# Patient Record
Sex: Male | Born: 1985 | Race: White | Hispanic: No | Marital: Single | Smoking: Former smoker
Health system: Southern US, Community
[De-identification: ages and names within clinical notes are randomized; demographics above are authoritative.]

## PROBLEM LIST (undated history)

## (undated) DIAGNOSIS — J302 Other seasonal allergic rhinitis: Secondary | ICD-10-CM

## (undated) HISTORY — DX: Other seasonal allergic rhinitis: J30.2

---
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
## 2011-04-25 ENCOUNTER — Ambulatory Visit (INDEPENDENT_AMBULATORY_CARE_PROVIDER_SITE_OTHER): Payer: 59 | Admitting: Family Medicine

## 2011-04-25 VITALS — BP 148/82 | HR 73 | Temp 98.9°F | Resp 16 | Ht 74.0 in | Wt 202.2 lb

## 2011-04-25 DIAGNOSIS — Z202 Contact with and (suspected) exposure to infections with a predominantly sexual mode of transmission: Secondary | ICD-10-CM

## 2011-04-25 MED ORDER — AZITHROMYCIN 250 MG PO TABS: 1000.0000 mg | ORAL_TABLET | Freq: Once | ORAL | Status: DC

## 2011-04-25 NOTE — Progress Notes (Addendum)
  Patient Name: Ricky Giles Date of Birth: 12/03/1985 Medical Record Number: [id number] Gender: male Date of Encounter: 04/25/2011  History of Present Illness:  Ricky Giles is a 26 y.o. very pleasant male patient who presents with the following:  Girlfriend went to doctor 2 days ago and was diagnosed with chlamydia. She has already been treated.  Patient notes no symptoms:  no penile discharge, no pain with urination.  Last urinated 2 hours ago.  Would like to go ahead and have BW to screen for other STI as well.  Otherwise is generally healthy  There is no problem list on file for this patient.  No past medical history on file. No past surgical history on file. History  Substance Use Topics  . Smoking status: Former Games developer  . Smokeless tobacco: Not on file  . Alcohol Use: Not on file   No family history on file. No Known Allergies  Medication list has been reviewed and updated.  Review of Systems: As per HPI- otherwise feels ok  Physical Examination: Filed Vitals:   04/25/11 1812  BP: 148/82  Pulse: 73  Temp: 98.9 F (37.2 C)  TempSrc: Oral  Resp: 16  Height: 6\' 2"  (1.88 m)  Weight: 202 lb 3.2 oz (91.717 kg)    Body mass index is 25.96 kg/(m^2).   GEN: WDWN, NAD, Non-toxic, Alert & Oriented x 3 HEENT: Atraumatic, Normocephalic.  Ears and Nose: No external deformity. EXTR: No clubbing/cyanosis/edema NEURO: Normal gait.  PSYCH: Normally interactive. Conversant. Not depressed or anxious appearing.  Calm demeanor.  Genitals: no discharge, normal penis and scrotum Noted that BP is a little elevated- patient admits he felt nervous when he first arrived today!  Assessment and Plan: 1. Exposure to STD  azithromycin (ZITHROMAX) 250 MG tablet, Hepatitis C antibody, HIV antibody, RPR, Hepatitis B surface antibody, Hepatitis B surface antigen, GC/chlamydia probe amp, urine  patient desires to do BW to check for any other STI at this time as well.  Will treat  for chlamydia with azithromycin 250 #4.  Avoid sex with GF until both are treated for 1-2 weeks.  Otherwise will follow- up pending labs.    Addnd 04/28/2011 Results for orders placed in visit on 04/25/11  HEPATITIS C ANTIBODY      Component Value Range   HCV Ab NEGATIVE  NEGATIVE   HIV ANTIBODY (ROUTINE TESTING)      Component Value Range   HIV NON REACTIVE  NON REACTIVE   RPR      Component Value Range   RPR NON REAC  NON REAC   HEPATITIS B SURFACE ANTIBODY, QUANTITATIVE      Component Value Range   Hepatitis B-Post 1.8    HEPATITIS B SURFACE ANTIGEN      Component Value Range   Hepatitis B Surface Ag NEGATIVE  NEGATIVE   GC/CHLAMYDIA PROBE AMP, URINE      Component Value Range   Chlamydia, Swab/Urine, PCR NEGATIVE  NEGATIVE    GC Probe Amp, Urine NEGATIVE  NEGATIVE    Called patient and went over results as above- he is actually negative for chlamydia.  He is happy to hear this news.  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

## 2011-04-27 LAB — HEPATITIS B SURFACE ANTIGEN: Hepatitis B Surface Ag: NEGATIVE

## 2011-04-27 LAB — HEPATITIS C ANTIBODY: HCV Ab: NEGATIVE

## 2011-04-27 LAB — GC/CHLAMYDIA PROBE AMP, URINE: Chlamydia, Swab/Urine, PCR: NEGATIVE

## 2011-04-27 LAB — HIV ANTIBODY (ROUTINE TESTING W REFLEX): HIV: NONREACTIVE

## 2011-04-27 LAB — HEPATITIS B SURFACE ANTIBODY, QUANTITATIVE: Hepatitis B-Post: 1.8 m[IU]/mL

## 2012-01-03 ENCOUNTER — Ambulatory Visit: Payer: 59 | Admitting: Family Medicine

## 2012-01-10 ENCOUNTER — Ambulatory Visit (INDEPENDENT_AMBULATORY_CARE_PROVIDER_SITE_OTHER): Payer: 59 | Admitting: Family Medicine

## 2012-01-10 VITALS — BP 123/77 | HR 68 | Temp 98.4°F | Resp 16 | Ht 75.0 in | Wt 203.0 lb

## 2012-01-10 DIAGNOSIS — Z79899 Other long term (current) drug therapy: Secondary | ICD-10-CM | POA: Insufficient documentation

## 2012-01-10 DIAGNOSIS — F988 Other specified behavioral and emotional disorders with onset usually occurring in childhood and adolescence: Secondary | ICD-10-CM | POA: Insufficient documentation

## 2012-01-10 MED ORDER — AMPHETAMINE-DEXTROAMPHETAMINE 10 MG PO TABS: 10.0000 mg | ORAL_TABLET | Freq: Two times a day (BID) | ORAL | Status: DC

## 2012-06-08 ENCOUNTER — Ambulatory Visit (INDEPENDENT_AMBULATORY_CARE_PROVIDER_SITE_OTHER): Payer: BC Managed Care – PPO | Admitting: Physician Assistant

## 2012-06-08 VITALS — BP 116/68 | HR 76 | Temp 98.2°F | Resp 16 | Ht 74.18 in | Wt 205.0 lb

## 2012-06-08 DIAGNOSIS — L237 Allergic contact dermatitis due to plants, except food: Secondary | ICD-10-CM

## 2012-06-08 MED ORDER — TRIAMCINOLONE ACETONIDE 0.1 % EX CREA: TOPICAL_CREAM | Freq: Two times a day (BID) | CUTANEOUS | Status: DC

## 2012-06-08 MED ORDER — METHYLPREDNISOLONE ACETATE 80 MG/ML IJ SUSP
80.0000 mg | Freq: Once | INTRAMUSCULAR | Status: AC
  Administered 2012-06-08: 80 mg via INTRAMUSCULAR

## 2012-06-08 NOTE — Patient Instructions (Addendum)
We have given you an injection of steroids in the office.  You may use topical steroid cream for additional itch relief if needed.  Over the counter Zyrtec may also help reduce itching.   Poison [employer] ivy is a inflammation of the skin (contact dermatitis) caused by touching the allergens on the leaves of the ivy plant following previous exposure to the plant. The rash usually appears 48 hours after exposure. The rash is usually bumps (papules) or blisters (vesicles) in a linear pattern. Depending on your own sensitivity, the rash may simply cause redness and itching, or it may also progress to blisters which may break open. These must be well cared for to prevent secondary bacterial (germ) infection, followed by scarring. Keep any open areas dry, clean, dressed, and covered with an antibacterial ointment if needed. The eyes may also get puffy. The puffiness is worst in the morning and gets better as the day progresses. This dermatitis usually heals without scarring, within 2 to 3 weeks without treatment. HOME CARE INSTRUCTIONS  Thoroughly wash with soap and water as soon as you have been exposed to poison ivy. You have about one half hour to remove the plant resin before it will cause the rash. This washing will destroy the oil or antigen on the skin that is causing, or will cause, the rash. Be sure to wash under your fingernails as any plant resin there will continue to spread the rash. Do not rub skin vigorously when washing affected area. Poison ivy cannot spread if no oil from the plant remains on your body. A rash that has progressed to weeping sores will not spread the rash unless you have not washed thoroughly. It is also important to wash any clothes you have been wearing as these may carry active allergens. The rash will return if you wear the unwashed clothing, even several days later. Avoidance of the plant in the future is the best measure. Poison ivy plant can be recognized by the number of  leaves. Generally, poison ivy has three leaves with flowering branches on a single stem. Diphenhydramine may be purchased over the counter and used as needed for itching. Do not drive with this medication if it makes you drowsy.Ask your caregiver about medication for children. SEEK MEDICAL CARE IF:  Open sores develop.  Redness spreads beyond area of rash.  You notice purulent (pus-like) discharge.  You have increased pain.  Other signs of infection develop (such as fever). Document Released: 03/08/2000 Document Revised: 06/03/2011 Document Reviewed: 01/25/2009 [facility] Patient Information 2013 [place], [place].

## 2012-06-08 NOTE — Progress Notes (Signed)
  Subjective:    Patient ID: Ricky Giles, male    DOB: 09/29/1985, 27 y.o.   MRN: [id number]  HPI   [name] is a pleasant 27 yr old male here after poison ivy exposure.  Knows he was exposed within the last 4-5 days.  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  Was exposed while working out in the brush.  Has had poison ivy numerous times in the past.  Has been treated with both PO and IM steroids in the past.  Would prefer IM steroids if possible.  Denies fever, chills, or any systemic symptoms.   Review of Systems  Constitutional: Negative for fever and chills.  HENT: Negative.   Respiratory: Negative.   Cardiovascular: Negative.   Gastrointestinal: Negative.   Skin: Positive for rash.  Neurological: Negative.        Objective:   Physical Exam  Vitals reviewed. Constitutional: He is oriented to person, place, and time. He appears well-developed and well-nourished. No distress.  HENT:  Head: Normocephalic and atraumatic.  Eyes: Conjunctivae are normal. No scleral icterus.  Cardiovascular: Normal rate, regular rhythm and normal heart sounds.  Exam reveals no gallop and no friction rub.   No murmur heard. Pulmonary/Chest: Effort normal and breath sounds normal. He has no wheezes. He has no rales.  Neurological: He is alert and oriented to person, place, and time.  Skin: Skin is warm and dry. Rash noted. Rash is vesicular (linear).     Multiple linear erythematous vesicles on bilateral arms and abdomen  Psychiatric: He has a normal mood and affect. His behavior is normal.       Filed Vitals:   06/08/12 1402  BP: 116/68  Pulse: 76  Temp: 98.2 F (36.8 C)  Resp: 16       Assessment & Plan:  Poison ivy - Plan: triamcinolone cream (KENALOG) 0.1 %, methylPREDNISolone acetate (DEPO-MEDROL) injection 80 mg   [name] is a pleasant 27 yr old male here with poison ivy.  IM depomedrol given in clinic.  Triamcinolone  topical BID if needed for further symptom relief.  Additionally pt will try otc Zyrtec if needed for itch relief.  Discussed avoidance of poison ivy in the future.  Pt will RTC if worsening or not improving.

## 2012-06-29 NOTE — Progress Notes (Signed)
  Subjective:    Patient ID: Ricky Giles, male    DOB: 1985/12/23, 27 y.o.   MRN: [id number] Chief Complaint  Patient presents with  . Medication Refill    wants to renew Adderall. Hasnt taken since 2006.    HPI    Review of Systems     BP 123/77  Pulse 68  Temp(Src) 98.4 F (36.9 C)  Resp 16  Ht 6\' 3"  (1.905 m)  Wt 203 lb (92.08 kg)  BMI 25.37 kg/m2 Objective:   Physical Exam        Assessment & Plan:  Attention deficit disorder of adult  Encounter for long-term (current) use of other medications  Meds ordered this encounter  Medications  . amphetamine-dextroamphetamine (ADDERALL) 10 MG tablet    Sig: Take 1 tablet (10 mg total) by mouth 2 (two) times daily.    Dispense:  60 tablet    Refill:  0

## 2012-11-06 ENCOUNTER — Other Ambulatory Visit: Payer: Self-pay

## 2012-11-06 MED ORDER — ALBUTEROL SULFATE HFA 108 (90 BASE) MCG/ACT IN AERS: 2.0000 | INHALATION_SPRAY | RESPIRATORY_TRACT | Status: DC | PRN

## 2013-04-09 ENCOUNTER — Other Ambulatory Visit: Payer: Self-pay | Admitting: Occupational Medicine

## 2013-04-09 ENCOUNTER — Ambulatory Visit: Payer: Self-pay

## 2013-04-09 DIAGNOSIS — Z0000 Encounter for general adult medical examination without abnormal findings: Secondary | ICD-10-CM

## 2013-09-07 ENCOUNTER — Ambulatory Visit (INDEPENDENT_AMBULATORY_CARE_PROVIDER_SITE_OTHER): Payer: BC Managed Care – PPO | Admitting: Emergency Medicine

## 2013-09-07 ENCOUNTER — Encounter: Payer: Self-pay | Admitting: Emergency Medicine

## 2013-09-07 VITALS — BP 112/74 | HR 52 | Temp 97.8°F | Ht 74.0 in | Wt 216.0 lb

## 2013-09-07 DIAGNOSIS — M7918 Myalgia, other site: Secondary | ICD-10-CM

## 2013-09-07 DIAGNOSIS — R0789 Other chest pain: Secondary | ICD-10-CM

## 2013-09-07 NOTE — Progress Notes (Signed)
   Subjective:    Patient ID: Mason JimMatthew Cangemi, male    DOB: [birth year], 28 y.o.   MRN: [id number]  HPI Right sided lower rib/flank pain.  Onset today.  Works as a Glass blower/designerfireman and tree service cutter. He denies specific injury that he recalls.  Does a lot of heavy lifting at work.  No radiation of pain.  No shortness of breath.  Pain with deep inspiration.  No cough.  No fever or chills.  No abdominal pain.  No nausea or vomiting.    PPMH:  Noncontributory.  SH:  Former smoker, occasional alcohol   Review of Systems As per HPI, otherwise negative.    Objective:   Physical Exam Blood pressure 112/74, pulse 52, temperature 97.8 F (36.6 C), temperature source Oral, height 6\' 2"  (1.88 m), weight 216 lb (97.977 kg), SpO2 98.00%. Body mass index is 27.72 kg/(m^2). Well-developed, well nourished male who is awake, alert and oriented, in NAD. HEENT: [place]/AT, PERRL, EOMI.  Sclera and conjunctiva are clear. Neck: supple Heart: RRR, no murmur Lungs: normal effort, CTA, TTP right intercostal muscles anteriorly 8-9 ribs. Extremities: no cyanosis, clubbing or edema. Skin: warm and dry without rash. Psychologic: good mood and appropriate affect, normal speech and behavior.     Assessment & Plan:  Intercostal muscle strain/pain  Oral OTC NSAIDS as needed.  Return for worsening symptoms.

## 2013-09-08 NOTE — Progress Notes (Signed)
I have read, reviewed and agree with note by Dr. McGrath. Damarri Rampy, MD 

## 2013-09-09 ENCOUNTER — Other Ambulatory Visit: Payer: Self-pay

## 2013-09-09 ENCOUNTER — Ambulatory Visit
Admission: RE | Admit: 2013-09-09 | Discharge: 2013-09-09 | Disposition: A | Discharge status: Home / Self Care | Payer: BC Managed Care – PPO | Source: Ambulatory Visit | Attending: Family Medicine | Admitting: Family Medicine

## 2013-09-09 ENCOUNTER — Other Ambulatory Visit: Payer: Self-pay | Admitting: Family Medicine

## 2013-09-09 ENCOUNTER — Ambulatory Visit (INDEPENDENT_AMBULATORY_CARE_PROVIDER_SITE_OTHER): Payer: BC Managed Care – PPO

## 2013-09-09 ENCOUNTER — Ambulatory Visit (INDEPENDENT_AMBULATORY_CARE_PROVIDER_SITE_OTHER): Payer: BC Managed Care – PPO | Admitting: Family Medicine

## 2013-09-09 ENCOUNTER — Telehealth: Payer: Self-pay

## 2013-09-09 VITALS — BP 110/74 | HR 98 | Temp 97.9°F | Resp 18 | Ht 75.0 in | Wt 211.0 lb

## 2013-09-09 DIAGNOSIS — R0789 Other chest pain: Secondary | ICD-10-CM

## 2013-09-09 DIAGNOSIS — R0781 Pleurodynia: Secondary | ICD-10-CM

## 2013-09-09 DIAGNOSIS — R071 Chest pain on breathing: Secondary | ICD-10-CM

## 2013-09-09 LAB — POCT CBC
Granulocyte percent: 63.6 %G (ref 37–80)
HCT, POC: 45.5 % (ref 43.5–53.7)
Hemoglobin: 14.9 g/dL (ref 14.1–18.1)
LYMPH, POC: 1.9 (ref 0.6–3.4)
MCH: 30 pg (ref 27–31.2)
MCHC: 32.7 g/dL (ref 31.8–35.4)
MCV: 91.7 fL (ref 80–97)
MID (CBC): 0.4 (ref 0–0.9)
MPV: 11.1 fL (ref 0–99.8)
PLATELET COUNT, POC: 218 10*3/uL (ref 142–424)
POC Granulocyte: 4.1 (ref 2–6.9)
POC LYMPH %: 29.8 % (ref 10–50)
POC MID %: 6.6 % (ref 0–12)
RBC: 4.96 M/uL (ref 4.69–6.13)
RDW, POC: 12.7 %
WBC: 6.4 10*3/uL (ref 4.6–10.2)

## 2013-09-09 MED ORDER — IOHEXOL 350 MG/ML SOLN
100.0000 mL | Freq: Once | INTRAVENOUS | Status: AC | PRN
  Administered 2013-09-09: 100 mL via INTRAVENOUS

## 2013-09-09 NOTE — Telephone Encounter (Signed)
Dr.Hopper, Pt states that he was referred to [facility] today, he would like to know your opinion about the results. Best#  [phone]

## 2013-09-09 NOTE — Patient Instructions (Addendum)
Go to [facility] ph# [phone] [address] at 1:45    Take ibuprofen 800 mg 3 times daily for the chest wall pain.  If the CT scan shows anything of major concern we will treat accordingly.  Return if not improving over the next 3 or 4 days.

## 2013-09-09 NOTE — Progress Notes (Signed)
Subjective: 28 year old man who is here tonight to go with pleuritic right chest pain. He was evaluated and treated with anti-inflammatory medications for pleurisy. He had intense pain in the right lower lateral chest wall with deep breathing or coughing. It hurting that night. Yesterday did better through the day after he had had some ibuprofen in the morning. He didn't take anything more last night, but after dinner developed a bad pain again. He had to breathe shallow in order to avoid hurting. It bothered him in the night. He feels somewhat better this morning. He had to leave p.m. at bedtime, and then at 2:30 AM he took an 800 mg ibuprofen. 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 Knows of no trauma to the chest wall. He has no history of calf pains. No fevers. No family history of blood clots.  Objective: Healthy-appearing man in no acute distress this morning. His chest is clear. Heart regular without murmurs. Abdomen soft without mass or tenderness. No chest wall tenderness. On deep inspiration there is pain in the lateral chest over about the 10th and 11th rib area on the right. No calf tenderness. Negative Homans.  Assessment: Right chest wall pain, probably pleural  Plan: Chest x-ray, CBC, and decide whether need a lung scan or not.  Results for orders placed in visit on 09/09/13  POCT CBC      Result Value Ref Range   WBC 6.4  4.6 - 10.2 K/uL   Lymph, poc 1.9  0.6 - 3.4   POC LYMPH PERCENT 29.8  10 - 50 %L   MID (cbc) 0.4  0 - 0.9   POC MID % 6.6  0 - 12 %M   POC Granulocyte 4.1  2 - 6.9   Granulocyte percent 63.6  37 - 80 %G   RBC 4.96  4.69 - 6.13 M/uL   Hemoglobin 14.9  14.1 - 18.1 g/dL   HCT, POC 40.945.5  81.143.5 - 53.7 %   MCV 91.7  80 - 97 fL   MCH, POC 30.0  27 - 31.2 pg   MCHC 32.7  31.8 - 35.4 g/dL   RDW, POC 91.412.7     Platelet Count, POC 218  142 - 424 K/uL   MPV 11.1  0 - 99.8 fL   [facility] reading (PRIMARY) by  Dr. Alwyn RenHopper Normal  cxr  Assessment: Persistent pleural chest pain, rule out embolus  Plan: CT angiogram of chest  CT scan of chest was negative. The team leader will notify the patient. If he does not get better he is to come back in.

## 2013-09-10 NOTE — Telephone Encounter (Signed)
Call patient: CT scan was entirely normal. I think this is probably just chest wall pain, and should resolve with a little bit of time. If not improving over the next few days return for a recheck

## 2013-09-10 NOTE — Telephone Encounter (Signed)
Results on pt CT scan are in imaging. Please advise results to call pt back with.

## 2013-09-13 ENCOUNTER — Telehealth: Payer: Self-pay | Admitting: *Deleted

## 2013-09-13 NOTE — Telephone Encounter (Signed)
Spoke to pt- he is doing better. He will rtc if pain is persistent.

## 2013-09-13 NOTE — Telephone Encounter (Signed)
Pt states that he is still having pain in his ribs and would like to know if we can call in 800 mg ibuprofen called in to the [facility] [place].  Please call [phone]

## 2013-09-14 NOTE — Telephone Encounter (Signed)
Okay to call in ibuprofen 800 mg #30 one 3 times daily with no refills.  Please advise the patient that if pain continues to persist I think he would be a good idea for him to get rechecked in the next few days.

## 2013-09-15 MED ORDER — IBUPROFEN 800 MG PO TABS: 800.0000 mg | ORAL_TABLET | Freq: Three times a day (TID) | ORAL | Status: AC | PRN

## 2013-09-15 NOTE — Telephone Encounter (Signed)
Sent script to pharmacy.  Lm advise pt Rx sent to the pharmacy and rtc if this does not help

## 2016-12-04 ENCOUNTER — Ambulatory Visit: Payer: Self-pay | Admitting: Physician Assistant

## 2017-01-29 ENCOUNTER — Encounter: Payer: Self-pay | Admitting: Physician Assistant

## 2017-02-07 ENCOUNTER — Encounter: Payer: Self-pay | Admitting: Physician Assistant

## 2017-02-07 ENCOUNTER — Encounter (INDEPENDENT_AMBULATORY_CARE_PROVIDER_SITE_OTHER): Payer: Self-pay

## 2017-02-07 ENCOUNTER — Ambulatory Visit: Payer: 59 | Admitting: Physician Assistant

## 2017-02-07 VITALS — BP 110/70 | HR 68 | Ht 74.0 in | Wt 207.0 lb

## 2017-02-07 DIAGNOSIS — K6289 Other specified diseases of anus and rectum: Secondary | ICD-10-CM | POA: Diagnosis not present

## 2017-02-07 DIAGNOSIS — K625 Hemorrhage of anus and rectum: Secondary | ICD-10-CM | POA: Diagnosis not present

## 2017-02-07 DIAGNOSIS — K602 Anal fissure, unspecified: Secondary | ICD-10-CM | POA: Diagnosis not present

## 2017-02-07 MED ORDER — AMBULATORY NON FORMULARY MEDICATION: 1 refills | Status: DC

## 2017-02-07 NOTE — Progress Notes (Addendum)
Chief Complaint: Rectal bleeding, rectal pain  HPI:    [name] is a 31 year old Caucasian male with a past medical history as listed below, who presents to clinic today with a complaint of some blood in his stools and rectal pain.    Today, the patient tells me that all of a sudden about 3-4 weeks ago he started seeing some bright red blood when he would wipe after a bowel movement.  [name] that he would see this bright red blood on "a couple of wipes", he would also see some in his stool but "it did not cloudy the water".  Patient tells me iit did seem to be worse first thing in the morning.  This was accompanied by a very "sharp/tearing" pain when he would have a bowel movement.  [name] this was almost to the point where he did not want to have a bowel movement.  He believes he may have been having diarrhea before this all started.  Most recently, the patient has seen no further bleeding for the past week, "at least not as much as it was".  He does admit to seeing a small amount of bright red blood on the toilet paper yesterday.  He has no further pain though.    Patient denies fever, chills, change in bowel habits, weight loss, anorexia, nausea, vomiting, heartburn or reflux.  Past Medical History:  Diagnosis Date  . Asthma    as a child  . Seasonal allergies     History reviewed. No pertinent surgical history.  Current Outpatient Medications  Medication Sig Dispense Refill  . ibuprofen (ADVIL,MOTRIN) 800 MG tablet Take 1 tablet (800 mg total) by mouth every 8 (eight) hours as needed. Take 1 tab TID PRN pain 30 tablet 0  . loratadine (CLARITIN) 10 MG tablet Take 10 mg by mouth daily.    . AMBULATORY NON FORMULARY MEDICATION Medication Name: Nitroglycerin 0.125 mg three times a day for 4-6 weeks 1 Tube 1   No current facility-administered medications for this visit.     Allergies as of 02/07/2017  . (No Known Allergies)    Family History  Problem  Relation Age of Onset  . Arthritis Mother   . Irritable bowel syndrome Mother   . Irritable bowel syndrome Father     Social History   Socioeconomic History  . Marital status: Single    Spouse name: Not on file  . Number of children: Not on file  . Years of education: Not on file  . Highest education level: Not on file  Social Needs  . Financial resource strain: Not on file  . Food insecurity - worry: Not on file  . Food insecurity - inability: Not on file  . Transportation needs - medical: Not on file  . Transportation needs - non-medical: Not on file  Occupational History  . Not on file  Tobacco Use  . Smoking status: Former Smoker  Substance and Sexual Activity  . Alcohol use: Yes    Alcohol/week: 1.8 oz    Types: 3 Cans of beer per week  . Drug use: No  . Sexual activity: Yes  Other Topics Concern  . Not on file  Social History Narrative  . Not on file    Review of Systems:    Constitutional: No weight loss, fever or chills Skin: No rash  Cardiovascular: No chest pain   Respiratory: No SOB Gastrointestinal: See HPI and otherwise negative Genitourinary: No dysuria  Neurological: No headache, dizziness or syncope Musculoskeletal: No new muscle or joint pain Hematologic: No bruising Psychiatric: No history of depression or anxiety    Physical Exam:  Vital signs: BP 110/70   Pulse 68   Ht 6\' 2"  (1.88 m)   Wt 207 lb (93.9 kg)   BMI 26.58 kg/m   Constitutional:   Pleasant Caucasian male appears to be in NAD, Well developed, Well nourished, alert and cooperative Head:  Normocephalic and atraumatic. Eyes:   PEERL, EOMI. No icterus. Conjunctiva pink. Ears:  Normal auditory acuity. Neck:  Supple Throat: Oral cavity and pharynx without inflammation, swelling or lesion.  Respiratory: Respirations even and unlabored. Lungs clear to auscultation bilaterally.   No wheezes, crackles, or rhonchi.  Cardiovascular: Normal S1, S2. No MRG. Regular rate and rhythm. No  peripheral edema, cyanosis or pallor.  Gastrointestinal:  Soft, nondistended, nontender. No rebound or guarding. Normal bowel sounds. No appreciable masses or hepatomegaly. Rectal:  External exam: Anterior fissure, ttp, no hemorrhoids; internal exam: not done due to ttp of fissure Msk:  Symmetrical without gross deformities. Without edema, no deformity or joint abnormality.  Neurologic:  Alert and  oriented x4;  grossly normal neurologically.  Skin:   Dry and intact without significant lesions or rashes. Psychiatric:  Demonstrates good judgement and reason without abnormal affect or behaviors.  No recent labs or imaging.  Assessment: 1.  Anal fissure: Anterior fissure seen at time of exam today, TTP, likely this is what has been causing the patient pain as well as some bleeding 2.  Rectal bleeding and pain: Related to above  Plan: 1.  Prescribed Nitroglycerin ointment 0.125% to be applied 3 times daily to patient's rectum with gloved fingertip.  He should continue this for at least 4-6 weeks. 2.  Recommend the patient try to do sitz baths for 15-20 minutes 2-3 times a day. 3.  Encouraged the patient to buy over-the-counter recta-care cream with lidocaine and apply this as needed for pain. 4.  Recommend the patient try to keep his stool as soft and solid as possible.  He should limit time on the toilet or any straining. Discussed fiber, water and stool softener if needed. 5.  Patient to follow in clinic as needed with Dr. Leone PayorGessner or myself in the future.  Hyacinth MeekerJennifer Woodford Strege, PA-C [facility] 02/07/2017, 1:26 PM  Agree with Ms. Lenard SimmerLemmon's evaluation and management.  Iva Booparl E. Gessner, MD, Clementeen GrahamFACG

## 2017-02-07 NOTE — Patient Instructions (Signed)
We have sent a prescription for nitroglycerin 0.125% gel to [facility]. You should apply a pea size amount to your rectum three times daily x 6-8 weeks.  [facility]'s information is below: Address: [address], [place], [place] [address]  [phone]  Please purchase the following medications over the counter and take as directed: Recticare with Lidocaine   Sitz baths two to three times daily.   How to Take a Sitz Bath A sitz bath is a warm water bath that is taken while you are sitting down. The water should only come up to your hips and should cover your buttocks. Your health care provider may recommend a sitz bath to help you:  Clean the lower part of your body, including your genital area.  With itching.  With pain.  With sore muscles or muscles that tighten or spasm.  How to take a sitz bath Take 3-4 sitz baths per day or as told by your health care provider. 1. Partially fill a bathtub with warm water. You will only need the water to be deep enough to cover your hips and buttocks when you are sitting in it. 2. If your health care provider told you to put medicine in the water, follow the directions exactly. 3. Sit in the water and open the tub drain a little. 4. Turn on the warm water again to keep the tub at the correct level. Keep the water running constantly. 5. Soak in the water for 15-20 minutes or as told by your health care provider. 6. After the sitz bath, pat the affected area dry first. Do not rub it. 7. Be careful when you stand up after the sitz bath because you may feel dizzy.  Contact a health care provider if:  Your symptoms get worse. Do not continue with sitz baths if your symptoms get worse.  You have new symptoms. Do not continue with sitz baths until you talk with your health care provider. This information is not intended to replace advice given to you by your health care provider. Make sure you discuss any questions you  have with your health care provider. Document Released: 12/02/2003 Document Revised: 08/09/2015 Document Reviewed: 03/09/2014 Elsevier Interactive Patient Education  [employer]2018 [employer] Inc[employer]

## 2017-12-25 ENCOUNTER — Other Ambulatory Visit: Payer: Self-pay

## 2017-12-25 ENCOUNTER — Emergency Department (HOSPITAL_BASED_OUTPATIENT_CLINIC_OR_DEPARTMENT_OTHER)
Admission: EM | Admit: 2017-12-25 | Discharge: 2017-12-26 | Disposition: A | Discharge status: Home / Self Care | Payer: 59 | Attending: Emergency Medicine | Admitting: Emergency Medicine

## 2017-12-25 ENCOUNTER — Emergency Department (HOSPITAL_BASED_OUTPATIENT_CLINIC_OR_DEPARTMENT_OTHER): Payer: 59

## 2017-12-25 ENCOUNTER — Encounter (HOSPITAL_BASED_OUTPATIENT_CLINIC_OR_DEPARTMENT_OTHER): Payer: Self-pay | Admitting: *Deleted

## 2017-12-25 DIAGNOSIS — Y9389 Activity, other specified: Secondary | ICD-10-CM | POA: Diagnosis not present

## 2017-12-25 DIAGNOSIS — Y929 Unspecified place or not applicable: Secondary | ICD-10-CM | POA: Diagnosis not present

## 2017-12-25 DIAGNOSIS — S01512A Laceration without foreign body of oral cavity, initial encounter: Secondary | ICD-10-CM | POA: Diagnosis present

## 2017-12-25 DIAGNOSIS — J45909 Unspecified asthma, uncomplicated: Secondary | ICD-10-CM | POA: Diagnosis not present

## 2017-12-25 DIAGNOSIS — Z87891 Personal history of nicotine dependence: Secondary | ICD-10-CM | POA: Diagnosis not present

## 2017-12-25 DIAGNOSIS — Z23 Encounter for immunization: Secondary | ICD-10-CM | POA: Insufficient documentation

## 2017-12-25 DIAGNOSIS — W208XXA Other cause of strike by thrown, projected or falling object, initial encounter: Secondary | ICD-10-CM | POA: Insufficient documentation

## 2017-12-25 DIAGNOSIS — Y990 Civilian activity done for income or pay: Secondary | ICD-10-CM | POA: Diagnosis not present

## 2017-12-25 DIAGNOSIS — S0993XA Unspecified injury of face, initial encounter: Secondary | ICD-10-CM

## 2017-12-25 MED ORDER — LIDOCAINE-EPINEPHRINE (PF) 2 %-1:200000 IJ SOLN
10.0000 mL | Freq: Once | INTRAMUSCULAR | Status: AC
  Administered 2017-12-25: 10 mL
  Filled 2017-12-25 (×2): qty 10

## 2017-12-25 MED ORDER — HYDROCODONE-ACETAMINOPHEN 5-325 MG PO TABS: 1.0000 | ORAL_TABLET | Freq: Four times a day (QID) | ORAL | 0 refills | Status: DC | PRN

## 2017-12-25 MED ORDER — LIDOCAINE-EPINEPHRINE-TETRACAINE (LET) SOLUTION
3.0000 mL | Freq: Once | NASAL | Status: AC
  Administered 2017-12-25: 19:00:00 3 mL via TOPICAL
  Filled 2017-12-25: qty 3

## 2017-12-25 MED ORDER — CLINDAMYCIN HCL 150 MG PO CAPS: 300.0000 mg | ORAL_CAPSULE | Freq: Three times a day (TID) | ORAL | 0 refills | Status: AC

## 2017-12-25 MED ORDER — CLINDAMYCIN HCL 150 MG PO CAPS
300.0000 mg | ORAL_CAPSULE | Freq: Once | ORAL | Status: AC
  Administered 2017-12-25: 300 mg via ORAL
  Filled 2017-12-25: qty 2

## 2017-12-25 MED ORDER — TETANUS-DIPHTH-ACELL PERTUSSIS 5-2.5-18.5 LF-MCG/0.5 IM SUSP
0.5000 mL | Freq: Once | INTRAMUSCULAR | Status: AC
  Administered 2017-12-25: 0.5 mL via INTRAMUSCULAR
  Filled 2017-12-25: qty 0.5

## 2017-12-25 NOTE — ED Provider Notes (Signed)
Medical screening examination/treatment/procedure(s) were conducted as a shared visit with non-physician practitioner(s) and myself.  I personally evaluated the patient during the encounter.  None  32yM with intraoral laceration along buccogingival fold from about premolar extending back to molars. Very brisk bleeding. Slowed with injection of lidocaine with epi and hemostasis after suturing. Diet/wound care discussed. Imaging negative.    LACERATION REPAIR Performed by: Raeford Razor Authorized by: Raeford Razor Consent: Verbal consent obtained. Risks and benefits: risks, benefits and alternatives were discussed Consent given by: patient Patient identity confirmed: provided demographic data Prepped and Draped in normal sterile fashion Wound explored  Laceration Location: intraoral  Laceration Length: 3 cm  No Foreign Bodies seen or palpated  Anesthesia: local infiltration  Local anesthetic: lidocaine 1% with epi Anesthetic total: 4 ml  4 horizontal mattress using 5-0 vicryl rapide.  Patient tolerance: Patient tolerated the procedure well with no immediate complications.  NERVE BLOCK Performed by: Raeford Razor Consent: Verbal consent obtained. Required items: required blood products, implants, devices, and special equipment available Time out: Immediately prior to procedure a "time out" was called to verify the correct patient, procedure, equipment, support staff and site/side marked as required.  Indication: pain Nerve block body site: L mental nerve  Preparation: Patient was prepped and draped in the usual sterile fashion. Needle gauge: 25 G Location technique: anatomical landmarks  Local anesthetic: 1% with lidocaine  Anesthetic total: 1 ml  Outcome: pain improved Patient tolerance: Patient tolerated the procedure well with no immediate complications.     Raeford Razor, MD 12/29/17 [phone]

## 2017-12-25 NOTE — ED Notes (Signed)
ED Provider at bedside. 

## 2017-12-25 NOTE — ED Triage Notes (Signed)
He was cutting a tree limb and it kicked back hitting him in the jaw. Swelling to his left jaw. He was seen earlier at [facility] and had sutures to his upper lip. She was unable to suture inside his jaw due to uncontrolled bleeding. Now the bleeding inside his jaw is worse.

## 2017-12-25 NOTE — Discharge Instructions (Addendum)
Please do warm salt water rinses 4 times a day.  Your stitches should dissolve on their own in time.  I have given you follow-up with oral surgery.  It is normal to continue oozing and bleeding, however this should be much slower than earlier.  Please eat soft foods and do not chew on your left side.  It is possible to rip through your stitches.    Your CAT scan today showed evidence of right sided sinus irritation, without evidence of facial fractures.  Your blood pressure was slightly high today, please make sure you get this rechecked in the next 1 to 2 weeks.  Please take Ibuprofen (Advil, motrin) and Tylenol (acetaminophen) to relieve your pain.  You may take up to 600 MG (3 pills) of normal strength ibuprofen every 8 hours as needed.  In between doses of ibuprofen you make take tylenol, up to 1,000 mg (two extra strength pills).  Do not take more than 3,000 mg tylenol in a 24 hour period.  Please check all medication labels as many medications such as pain and cold medications may contain tylenol.  Do not drink alcohol while taking these medications.  Do not take other NSAID'S while taking ibuprofen (such as aleve or naproxen).  Please take ibuprofen with food to decrease stomach upset.  You may have diarrhea from the antibiotics.  It is very important that you continue to take the antibiotics even if you get diarrhea unless a medical professional tells you that you may stop taking them.  If you stop too early the bacteria you are being treated for will become stronger and you may need different, more powerful antibiotics that have more side effects and worsening diarrhea.  Please stay well hydrated and consider probiotics as they may decrease the severity of your diarrhea.    You are being prescribed a medication which may make you sleepy. For 24 hours after one dose please do not drive, operate heavy machinery, care for a small child with out another adult present, or perform any activities that may  cause harm to you or someone else if you were to fall asleep or be impaired.

## 2017-12-25 NOTE — ED Provider Notes (Signed)
[facility] EMERGENCY DEPARTMENT Provider Note   CSN: [id number] Arrival date & time: 12/25/17  1732     History   Chief Complaint Chief Complaint  Patient presents with  . Facial Injury    HPI Ricky Giles is a 32 y.o. male who presents today for evaluation of facial injuries.  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  He went to urgent care earlier where they sutured his upper lip and sent him home.  He then tried to eat and started having large amounts of bleeding inside his mouth on the left side.  He does not take any blood thinning medications.  He reports that he has had increased swelling since.  He is unsure when his last tetanus shot was.  He was not placed on IV antibiotics by urgent care.  He does not have a dentist who he has seen recently.  He reports that his neck does not hurt, denies any headache, blurry or double vision.  HPI  Past Medical History:  Diagnosis Date  . Asthma    as a child  . Seasonal allergies     Patient Active Problem List   Diagnosis Date Noted  . Attention deficit disorder of adult 01/10/2012  . Encounter for long-term (current) use of other medications 01/10/2012    History reviewed. No pertinent surgical history.      Home Medications    Prior to Admission medications   Medication Sig Start Date End Date Taking? Authorizing Provider  ibuprofen (ADVIL,MOTRIN) 800 MG tablet Take 1 tablet (800 mg total) by mouth every 8 (eight) hours as needed. Take 1 tab TID PRN pain 09/15/13  Yes Peyton Najjar, MD  loratadine (CLARITIN) 10 MG tablet Take 10 mg by mouth daily.   Yes [provider]  AMBULATORY NON FORMULARY MEDICATION Medication Name: Nitroglycerin 0.125 mg three times a day for 4-6 weeks 02/07/17   Unk Lightning, PA  clindamycin (CLEOCIN) 150 MG capsule Take 2 capsules (300 mg total) by mouth 3  (three) times daily for 3 days. 12/25/17 12/28/17  Cristina Gong, PA-C  HYDROcodone-acetaminophen (NORCO/VICODIN) 5-325 MG tablet Take 1 tablet by mouth every 6 (six) hours as needed. 12/25/17   Cristina Gong, PA-C    Family History Family History  Problem Relation Age of Onset  . Arthritis Mother   . Irritable bowel syndrome Mother   . Irritable bowel syndrome Father     Social History Social History   Tobacco Use  . Smoking status: Former Games developer  . Smokeless tobacco: Never Used  Substance Use Topics  . Alcohol use: Yes    Alcohol/week: 3.0 standard drinks    Types: 3 Cans of beer per week  . Drug use: No     Allergies   Patient has no known allergies.   Review of Systems Review of Systems  Constitutional: Negative for chills and fever.  HENT: Positive for facial swelling. Negative for dental problem (Feels like his teeth line up normal.), nosebleeds, sinus pressure, sinus pain, sneezing and trouble swallowing.   Musculoskeletal: Negative for neck pain and neck stiffness.  Neurological: Negative for weakness and headaches.  All other systems reviewed and are negative.    Physical Exam Updated Vital Signs BP (!) 144/106 (BP Location: Left Arm)   Pulse (!) 59   Temp 98.3 F (36.8 C) (Oral)  Resp 18   Ht 6\' 2"  (1.88 m)   Wt 90.7 kg   SpO2 100%   BMI 25.68 kg/m   Physical Exam  Constitutional: He appears well-developed and well-nourished. No distress.  HENT:  Head: Normocephalic.  Mouth/Throat: Oropharynx is clear and moist and mucous membranes are normal. No trismus in the jaw. Normal dentition. Lacerations present. No uvula swelling or dental caries.  There is swelling along the left mandibular jaw with bony tenderness to palpation.  There is brisk oozing of blood along the left mandibula where the buccal mucosa and gingival mucosa meet.  If it is from approximately lateral incisor to second premolar, measures approximately 3 cm.  There is what  appears to be one suture present in the upper lip.  Eyes: Conjunctivae are normal. Right eye exhibits no discharge. Left eye exhibits no discharge. No scleral icterus.  Neck: Normal range of motion. Neck supple.  No midline or paraspinal tenderness to palpation, no step-offs or deformities.  Cardiovascular: Normal rate and regular rhythm.  Pulmonary/Chest: Effort normal. No stridor. No respiratory distress.  Abdominal: He exhibits no distension.  Musculoskeletal: He exhibits no edema or deformity.  Neurological: He is alert. He exhibits normal muscle tone.  Skin: Skin is warm and dry. He is not diaphoretic.  Psychiatric: He has a normal mood and affect. His behavior is normal.  Nursing note and vitals reviewed.    ED Treatments / Results  Labs (all labs ordered are listed, but only abnormal results are displayed) Labs Reviewed - No data to display  EKG None  Radiology Ct Maxillofacial Wo Cm  Result Date: 12/25/2017 CLINICAL DATA:  Left mandibular pain and swelling after being hit with a tree limb. Upper lip laceration sutured. Additional suturing in the mandibular area could not be performed due to uncontrolled bleeding. EXAM: CT MAXILLOFACIAL WITHOUT CONTRAST TECHNIQUE: Multidetector CT imaging of the maxillofacial structures was performed. Multiplanar CT image reconstructions were also generated. COMPARISON:  None. FINDINGS: Osseous: Normal appearing bones without fracture or dislocation. Orbits: Negative. No traumatic or inflammatory finding. Sinuses: Mild to moderate right maxillary sinus mucosal thickening. Small left maxillary sinus retention cysts. Soft tissues: Soft tissue air and blood or packing material along the lateral aspect of the left mandible. The remainder the soft tissues are unremarkable. Limited intracranial: No significant or unexpected finding. IMPRESSION: 1. Soft tissue air and blood or packing material along the lateral aspect of the left mandible. 2. No fracture or  dislocation. 3. Mild to moderate chronic right maxillary sinusitis. Electronically Signed   By: Beckie Salts M.D.   On: 12/25/2017 18:59    Procedures Procedures (including critical care time) Please see suture documentation by Dr. Juleen China.   Medications Ordered in ED Medications  Tdap (BOOSTRIX) injection 0.5 mL (has no administration in time range)  clindamycin (CLEOCIN) capsule 300 mg (has no administration in time range)  lidocaine-EPINEPHrine-tetracaine (LET) solution (3 mLs Topical Given 12/25/17 1912)  lidocaine-EPINEPHrine (XYLOCAINE W/EPI) 2 %-1:200000 (PF) injection 10 mL (10 mLs Infiltration Given 12/25/17 1912)     Initial Impression / Assessment and Plan / ED Course  I have reviewed the triage vital signs and the nursing notes.  Pertinent labs & imaging results that were available during my care of the patient were reviewed by me and considered in my medical decision making (see chart for details).    Patient presents today for evaluation of intraoral bleeding after a traumatic facial injury today.  He had a large amount of blood coming  from his left mandibular intraoral area.  CT face was performed which did not show any fractures or other osseous abnormalities.  He does not have any neck pain or tenderness, and does not have any headache.  The wound was repaired by Dr. Juleen China, please see his note.  Bleeding was controlled with sutures, and epinephrine.  He was observed in the emergency room afterwards which he had minor oozing without significant recurrence of bleeding.  He was instructed on a soft diet, he was given follow-up with oral surgery as needed.  Return precautions were discussed with patient who states their understanding.  At the time of discharge patient denied any unaddressed complaints or concerns.  Patient is agreeable for discharge home.  [facility] PMP was reviewed prior to the prescription of narcotic pain medicines for at home.   Final Clinical  Impressions(s) / ED Diagnoses   Final diagnoses:  Facial injury, initial encounter  Laceration of oral cavity, initial encounter    ED Discharge Orders         Ordered    clindamycin (CLEOCIN) 150 MG capsule  3 times daily     12/25/17 2046    HYDROcodone-acetaminophen (NORCO/VICODIN) 5-325 MG tablet  Every 6 hours PRN     12/25/17 2046           Cristina Gong, PA-C 12/25/17 2204    Raeford Razor, MD 12/29/17 [phone]

## 2019-04-05 IMAGING — CT CT MAXILLOFACIAL W/O CM
3 series · 15 of 47 positions shown, 18 images · non-contrast
Comparison: None.

CLINICAL DATA: Left mandibular pain and swelling after being hit
with a tree limb. Upper lip laceration sutured. Additional suturing
in the mandibular area could not be performed due to uncontrolled
bleeding.

EXAM:
CT MAXILLOFACIAL WITHOUT CONTRAST
TECHNIQUE: Multidetector CT imaging of the maxillofacial structures was
performed. Multiplanar CT image reconstructions were also generated.

[Series 2: max soft · axial · 0.43mm/px · z∈[+907,+1085]mm · 9 of 105 slices shown, 12 images]
[im 8/105  brain]
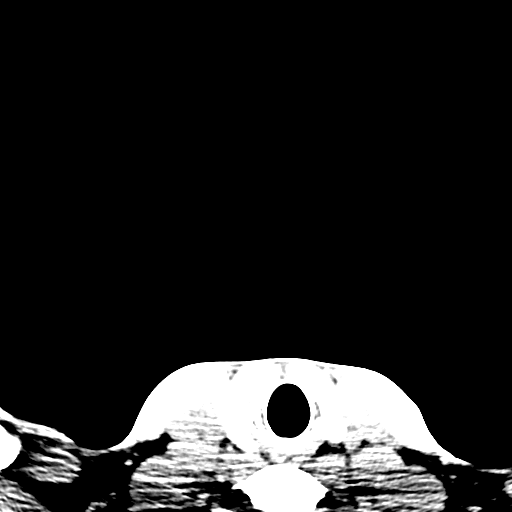
[im 8/105  bone]
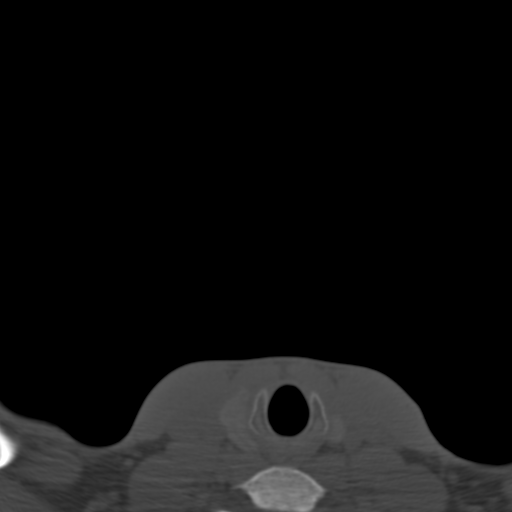
[im 18/105  bone]
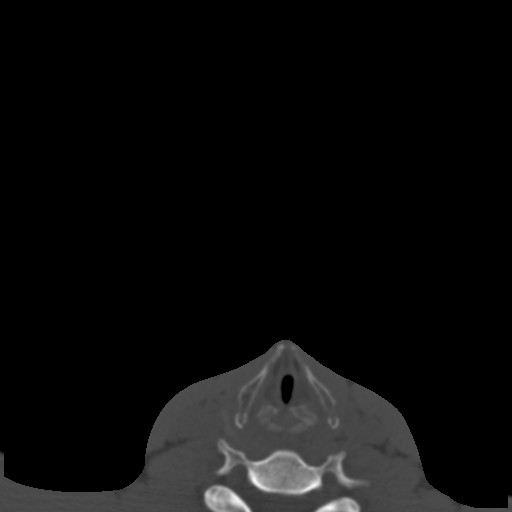
[im 29/105  bone]
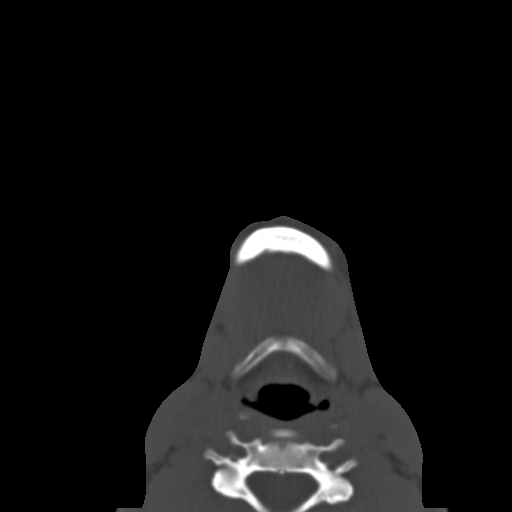
[im 40/105  bone]
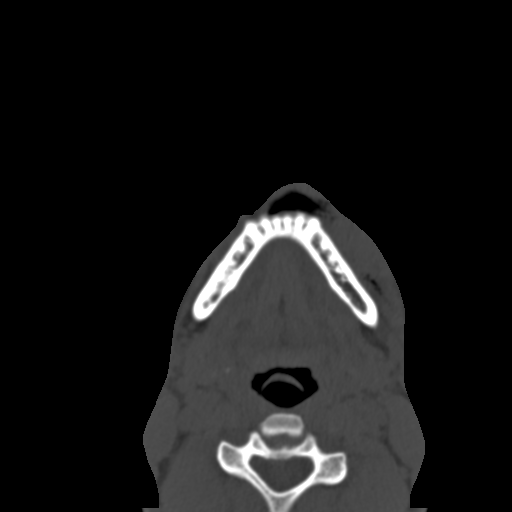
[im 54/105  brain]
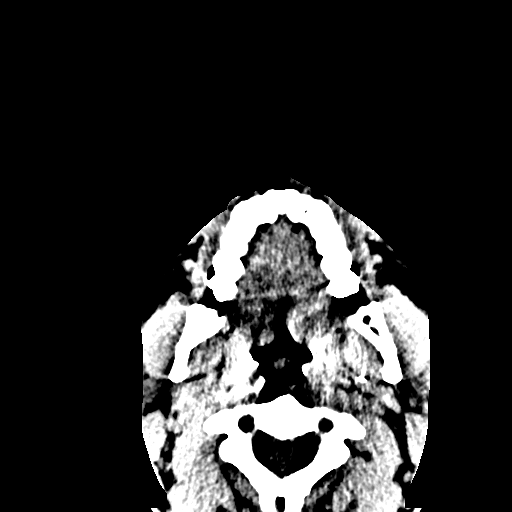
[im 54/105  bone]
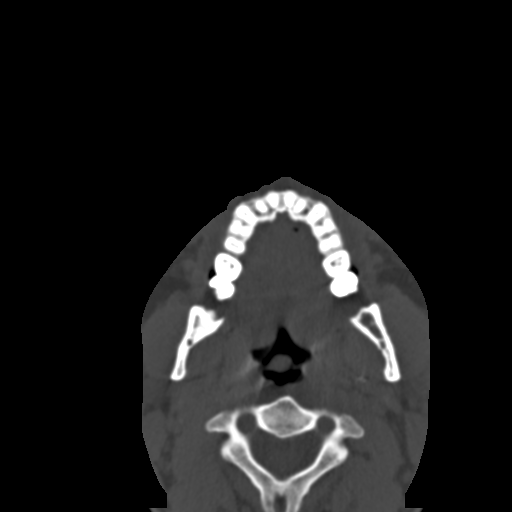
[im 65/105  bone]
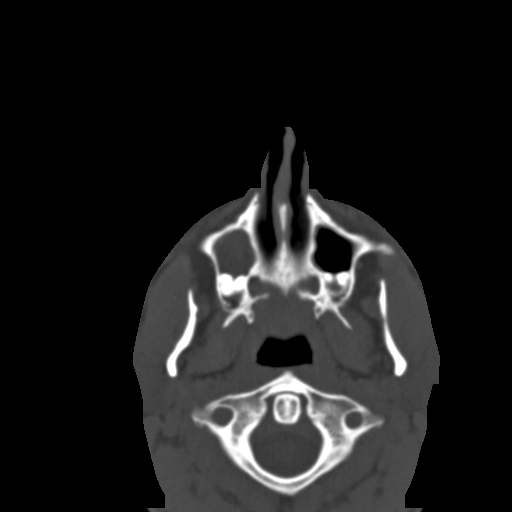
[im 76/105  bone]
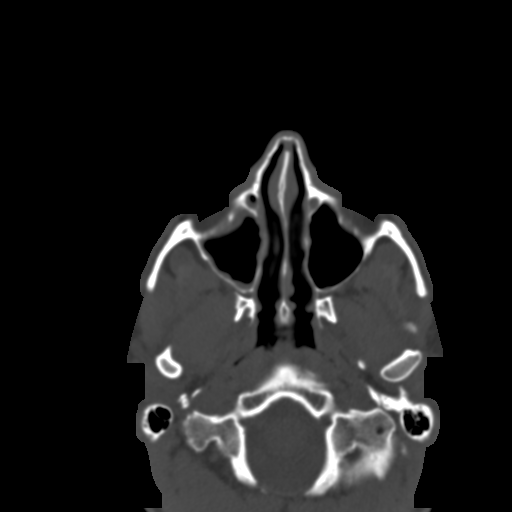
[im 87/105  bone]
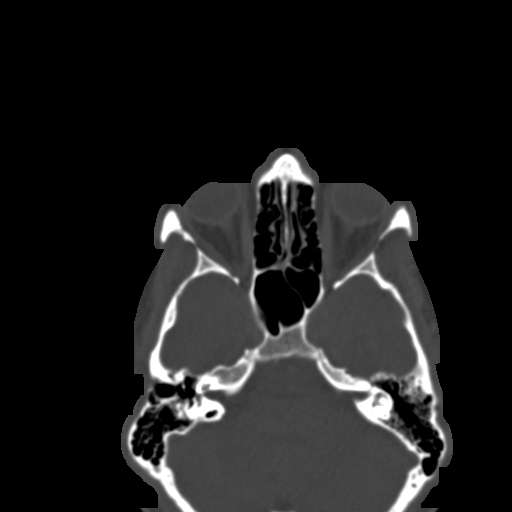
[im 97/105  brain]
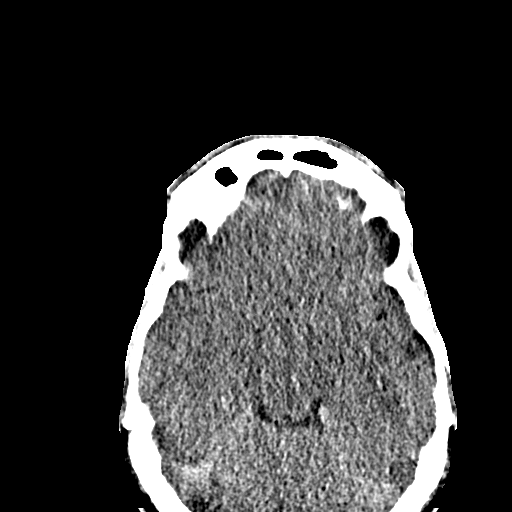
[im 97/105  bone]
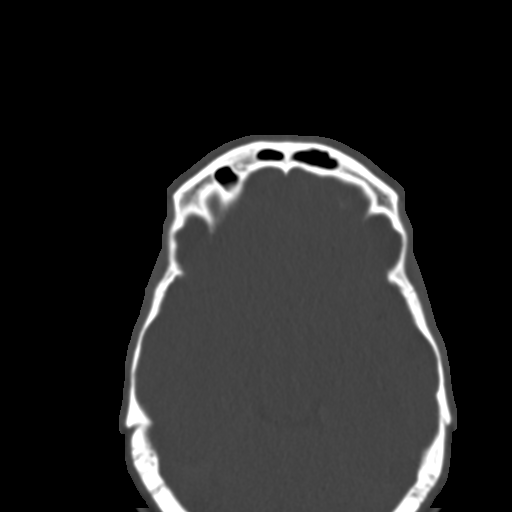

[Series 6: coronal soft · coronal · 0.44mm/px · 3 of 78 slices shown]
[im 26/78  bone]
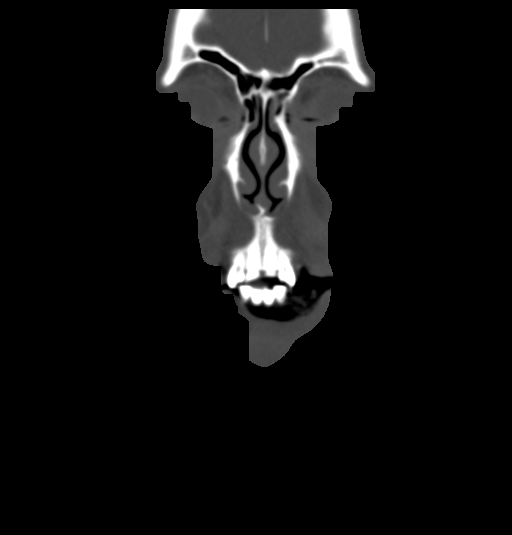
[im 35/78  bone]
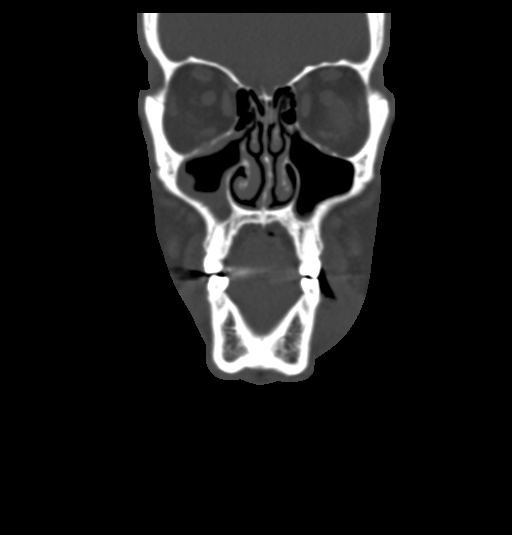
[im 43/78  bone]
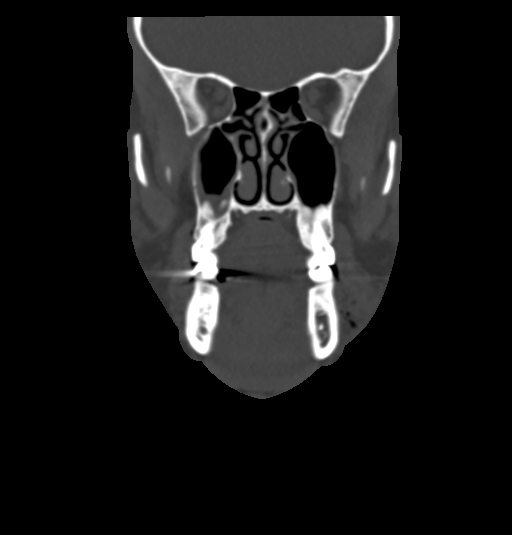

[Series 8: sagittal soft · sagittal · 0.30mm/px · 3 of 113 slices shown]
[im 38/113  bone]
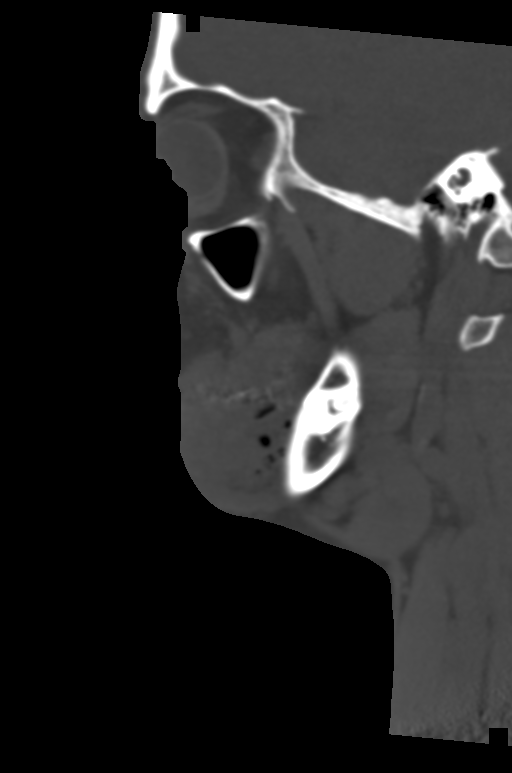
[im 57/113  bone]
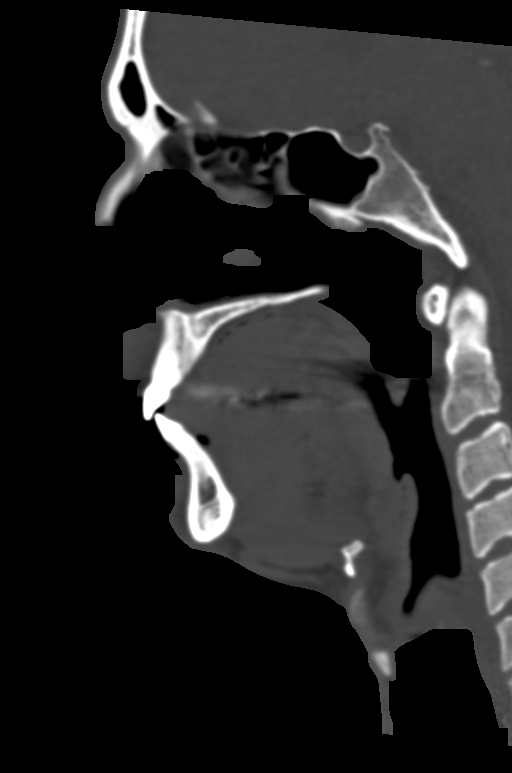
[im 75/113  bone]
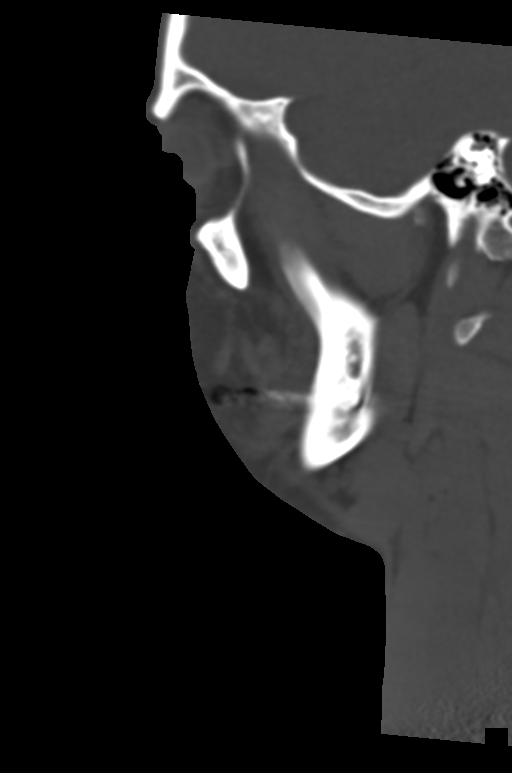

[15 of 47 positions shown; findings below may reference images not displayed]

FINDINGS: Osseous: Normal appearing bones without fracture or dislocation.

Orbits: Negative. No traumatic or inflammatory finding.

Sinuses: Mild to moderate right maxillary sinus mucosal thickening.
Small left maxillary sinus retention cysts.

Soft tissues: Soft tissue air and blood or packing material along
the lateral aspect of the left mandible. The remainder the soft
tissues are unremarkable.

Limited intracranial: No significant or unexpected finding.
IMPRESSION: 1. Soft tissue air and blood or packing material along the lateral
aspect of the left mandible.
2. No fracture or dislocation.
3. Mild to moderate chronic right maxillary sinusitis.

## 2019-04-27 ENCOUNTER — Ambulatory Visit: Payer: Self-pay

## 2019-04-27 ENCOUNTER — Encounter: Payer: Self-pay | Admitting: Family Medicine

## 2019-04-27 ENCOUNTER — Ambulatory Visit: Payer: 59 | Admitting: Family Medicine

## 2019-04-27 ENCOUNTER — Other Ambulatory Visit: Payer: Self-pay

## 2019-04-27 DIAGNOSIS — M25521 Pain in right elbow: Secondary | ICD-10-CM

## 2019-04-27 NOTE — Progress Notes (Signed)
Office Visit Note   Patient: Ricky Giles           Date of Birth: 01/21/1986           MRN: [id number] Visit Date: 04/27/2019 Requested by: No referring provider defined for this encounter. PCP: Patient, No Pcp Per  Subjective: Chief Complaint  Patient presents with  . Right Elbow - Pain    Pain in right elbow since November 2020. No known injury. Pain lateral aspect, but radiates down the forearm and up into the upper arm. Right-hand dominant.    HPI: He is here at the request of Dr. Lianne Cure for right elbow pain.  He is right-hand dominant.  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  Around that time he started noticing pain at the lateral elbow.  It has continued to bother him since then.  No numbness, it hurts when lifting things in certain ways.  He tried an elbow sleeve with some temporary relief.  He tried acupuncture with temporary relief.  No previous problems with his elbow.  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              ROS:   All other systems were reviewed and are negative.  Objective: Vital Signs: There were no vitals taken for this visit.  Physical Exam:  General:  Alert and oriented, in no acute distress. Pulm:  Breathing unlabored. Psy:  Normal mood, congruent affect. Skin: No erythema or rash. Right elbow: Full active range of motion, no effusion.  Point tender at the common extensor tendon at the lateral epicondyle.  No pain at the radial tunnel.  He has pain with wrist extension and third finger extension against resistance, also pain with forearm pronation and supination.  Imaging: Limited diagnostic ultrasound: He has tendinopathy changes in the common extensor tendon with no definite tears.  Slight hyperemia on power Doppler imaging.  Assessment & Plan: 1.  Right elbow lateral epicondylitis -Discussed various options with him and he wants to try dextrose  prolotherapy today.  Return in 3 to 4 weeks for second of 3 to 5 injections depending on how he does with this 1. -We will also try therapeutic ultrasound per Dr. Manson Passey. -Avoid NSAIDs for 2 weeks after this injection.     Procedures: Right elbow injection: After sterile prep with Betadine, injected 3 cc 1% lidocaine without epinephrine and 2 cc 50% dextrose solution into the area of maximum tenderness at the common extensor tendon.  He had a complete relief during the immediate anesthetic phase.    PMFS History: Patient Active Problem List   Diagnosis Date Noted  . Attention deficit disorder of adult 01/10/2012  . Encounter for long-term (current) use of other medications 01/10/2012   Past Medical History:  Diagnosis Date  . Asthma    as a child  . Seasonal allergies     Family History  Problem Relation Age of Onset  . Arthritis Mother   . Irritable bowel syndrome Mother   . Irritable bowel syndrome Father     History reviewed. No pertinent surgical history. Social History   Occupational History  . Not on file  Tobacco Use  . Smoking status: Former Games developer  . Smokeless tobacco: Never Used  Substance and Sexual Activity  . Alcohol use: Yes    Alcohol/week: 3.0 standard drinks    Types: 3 Cans of beer per week  .  Drug use: No  . Sexual activity: Yes

## 2021-04-11 ENCOUNTER — Ambulatory Visit: Payer: BC Managed Care – PPO | Admitting: Podiatry

## 2021-04-12 ENCOUNTER — Ambulatory Visit (INDEPENDENT_AMBULATORY_CARE_PROVIDER_SITE_OTHER): Payer: BC Managed Care – PPO | Admitting: Podiatry

## 2021-04-12 ENCOUNTER — Ambulatory Visit (INDEPENDENT_AMBULATORY_CARE_PROVIDER_SITE_OTHER): Payer: BC Managed Care – PPO

## 2021-04-12 ENCOUNTER — Other Ambulatory Visit: Payer: Self-pay

## 2021-04-12 ENCOUNTER — Encounter: Payer: Self-pay | Admitting: Podiatry

## 2021-04-12 DIAGNOSIS — S9032XA Contusion of left foot, initial encounter: Secondary | ICD-10-CM

## 2021-04-12 DIAGNOSIS — L02619 Cutaneous abscess of unspecified foot: Secondary | ICD-10-CM

## 2021-04-12 DIAGNOSIS — S90852A Superficial foreign body, left foot, initial encounter: Secondary | ICD-10-CM

## 2021-04-12 DIAGNOSIS — L03119 Cellulitis of unspecified part of limb: Secondary | ICD-10-CM | POA: Diagnosis not present

## 2021-04-12 MED ORDER — MUPIROCIN 2 % EX OINT: 1.0000 "application " | TOPICAL_OINTMENT | Freq: Two times a day (BID) | CUTANEOUS | 0 refills | Status: AC

## 2021-04-12 MED ORDER — AMOXICILLIN-POT CLAVULANATE 875-125 MG PO TABS: 1.0000 | ORAL_TABLET | Freq: Two times a day (BID) | ORAL | 0 refills | Status: AC

## 2021-04-12 NOTE — Progress Notes (Signed)
°  Subjective:  Patient ID: Ricky Giles, male    DOB: October 02, 1985,  MRN: [id number] HPI Chief Complaint  Patient presents with   Foot Pain    Plantar forefoot left - stepped on something x 3 weeks ago, cleaned initially with alcohol, few days ago got really sore and tender, swollen and red   New Patient (Initial Visit)    36 y.o. male presents with the above complaint.   ROS: Denies fever chills nausea vomiting muscle aches pains fever chills nausea vomiting muscle aches pains calf pain back pain chest pain shortness of breath.  Past Medical History:  Diagnosis Date   Asthma    as a child   Seasonal allergies    No past surgical history on file.  Current Outpatient Medications:    amoxicillin-clavulanate (AUGMENTIN) 875-125 MG tablet, Take 1 tablet by mouth 2 (two) times daily., Disp: 20 tablet, Rfl: 0   mupirocin ointment (BACTROBAN) 2 %, Apply 1 application topically 2 (two) times daily., Disp: 22 g, Rfl: 0   cetirizine (ZYRTEC) 10 MG tablet, Take by mouth., Disp: , Rfl:    ibuprofen (ADVIL,MOTRIN) 800 MG tablet, Take 1 tablet (800 mg total) by mouth every 8 (eight) hours as needed. Take 1 tab TID PRN pain, Disp: 30 tablet, Rfl: 0   loratadine (CLARITIN) 10 MG tablet, Take 10 mg by mouth daily., Disp: , Rfl:   No Known Allergies Review of Systems Objective:  There were no vitals filed for this visit.  General: Well developed, nourished, in no acute distress, alert and oriented x3   Dermatological: Skin is warm, dry and supple bilateral. Nails x 10 are well maintained; remaining integument appears unremarkable at this time. There are no open sores, no preulcerative lesions, no rash or signs of infection present.  There appears to be a puncture wound to the plantar aspect of the left foot that has healed over but does demonstrate pus beneath the skin.  There is mild cellulitis extending to the level of the toes distally but not moving proximally.  Moderately tender to  touch.  Vascular: Dorsalis Pedis artery and Posterior Tibial artery pedal pulses are 2/4 bilateral with immedate capillary fill time. Pedal hair growth present. No varicosities and no lower extremity edema present bilateral.   Neruologic: Grossly intact via light touch bilateral. Vibratory intact via tuning fork bilateral. Protective threshold with Semmes Wienstein monofilament intact to all pedal sites bilateral. Patellar and Achilles deep tendon reflexes 2+ bilateral. No Babinski or clonus noted bilateral.   Musculoskeletal: No gross boney pedal deformities bilateral. No pain, crepitus, or limitation noted with foot and ankle range of motion bilateral. Muscular strength 5/5 in all groups tested bilateral.  Gait: Unassisted, Nonantalgic.    Radiographs:  Radiographs taken today with a marker do not demonstrate any type of foreign body to the forefoot.  No acute findings otherwise.  Assessment & Plan:   Assessment: Abscess secondary puncture wound mild cellulitis left foot  Plan: Debrided the roof of the puncture wound today so this is considered an I&D purulence and malodor was noted I did swab for culture and sensitivity I did start him on Augmentin after flushing the area thoroughly I then placed Silvadene cream and a dry sterile dressing.  Also wrote a prescription for Bactroban ointment which should be applied once daily after soaking in Epson salts and warm water.  I will follow-up with him in 1 week for reevaluation.     Martasia Talamante T. [place], [place]

## 2021-04-16 LAB — WOUND CULTURE
MICRO NUMBER:: 12892543
SPECIMEN QUALITY:: ADEQUATE

## 2021-04-17 ENCOUNTER — Ambulatory Visit: Payer: BC Managed Care – PPO | Admitting: Podiatry

## 2021-04-19 ENCOUNTER — Other Ambulatory Visit: Payer: Self-pay

## 2021-04-19 ENCOUNTER — Encounter: Payer: Self-pay | Admitting: Podiatry

## 2021-04-19 ENCOUNTER — Ambulatory Visit: Payer: BC Managed Care – PPO | Admitting: Podiatry

## 2021-04-19 DIAGNOSIS — L02619 Cutaneous abscess of unspecified foot: Secondary | ICD-10-CM | POA: Diagnosis not present

## 2021-04-19 DIAGNOSIS — L03119 Cellulitis of unspecified part of limb: Secondary | ICD-10-CM

## 2021-04-19 DIAGNOSIS — B070 Plantar wart: Secondary | ICD-10-CM

## 2021-04-19 NOTE — Patient Instructions (Signed)
Wart Surgery-Directions for Home Care  You will need: Dial antibacterial hand soap,  gauze,  Band-aids  Keep the original bandage on until the following morning.  Bathe or shower with the bandage on allowing it to soak, so that when removed it won't stick to the wound. After showering or bathing, remove the old bandage and cleanse the area with Dial soap and water.  Place a few drops of Dial soap and water on a piece of guaze and gently scrub the area.  Dry with a clean piece of gauze. Apply antibiotic cream (polysporin, triple antibiotic or similar) to the area and place a clean square gauze bandage over and cover with a band-aid. In the evening, add a few drops of Dial soap to a basin of lukewarm water and soak your foot for 15 minutes.  After soaking, follow the instructions above for cleaning the area. Continue cleansing the area as described above two times a day, applying sterile gauze dressings until the doctor informs you that it is not needed. The time required to heal the surgical site will depend upon the size and location of the wart.  Lesions under bony prominences heal slower.  The average healing time is 2 to 4 weeks. Take over the counter Ibuprofen or Tylenol as needed should you experience any discomfort If you do experience discomfort after surgery, keep the foot elevated and apply an ice pack over your ankle, 30 minutes on, 30 minutes off each hour for the rest of the day. If you have any questions , please do not hesitate to contact the office.  WARTS (Verrucae)  Warts are caused by a virus that has invaded the skin.  They are more common in young adults and children and a small percentage will resolve on their own.  There are many types of warts including mosaic warts (large flat), vulgaris (domed warts-have pearl like appearance), and plantar warts (flat or cauliflower like appearance).  Warts are highly contagious and may be picked up from any surface.  Warts thrive in a warm  moist environment and are common near pools, showers, and locker room floors.  Any microscopic cut in the skin is where the virus enters and becomes a wart.  Warts are very difficult to treat and get rid of.  Patience is necessary in the treatment of this virus.  It may take months to cure and different methods may have to be used to get rid of your wart.  Standard Initial Treatment is: Periodic debridement of the wart and application of Canthacur to each lesion (a blistering agent that will slough off the warty skin) Dispensing of topical treatments/prescriptions to apply to the wart at home  Other options include: Excision of the lesion-numbing the skin around the wart and cutting it out-requires daily soaks post-operatively and takes about 2-3 weeks to fully heal Excision with CO2 Laser-Performed at the surgical center your foot is numbed up and the lesions are all cut out and then lasered with a high power laser.  Very good for multiple warts that are resistant. Cimetidine (Tagamet)-Oral agent used in high does--has shown better results in children  How do I apply the standard topical treatments?  Salicylic Acid (Compound W wart remover liquid or gel-available at drug or grocery stores)-Apply a dime size thickness over the wart and cover with duct tape-apply at night so the medication does not spread out to the good skin.  The skin will turn white and slowly blister off.  Use a   pumice stone daily to remove the white skin as best you can.  If the skin gets too raw and painful, discontinue for a few days then resume. Aldara (Imiquimod)-this is an immune response modifier.  They come in little packets so try to get at least 2 days out of each packet if you can.  Apply a small amount to the lesion and cover with duct tape.  Do not rub it in-let it absorb on its own.  Good to apply each morning.  Other Helpful Hints: Wash shoes that can be washed in the washing machine 2-3 x per month with some  bleach Use Lysol in shoes that cannot be washed and wipe out with a cloth 1 x per week-allow to dry for 8 hours before wearing again Use a bleach solution (1 part bleach to 3 parts water) in your tub or shower to reduce the spread of the virus to yourself and others Use aqua socks or clean sandals when at the pool or locker room to reduce the chance of picking up the virus or spreading it to others  

## 2021-04-19 NOTE — Progress Notes (Signed)
He presents today for follow-up of his plantar abscess he states that he still taking his antibiotics but his foot is feeling much better.  He is asking questions about a wart on the right foot.  Objective: Vital signs stable alert oriented x3 still has some tenderness at the incision site where the foreign body was.  Or where his abscess was.  Its mildly tender but no erythema cellulitis drainage or odor appears to be resolving nearly 100%.  Contralateral foot does demonstrate a 0.7 cm verrucoid lesion skin lines circumvent the lesion thrombosed capillaries are visible.  Assessment: Well-healing abscess but verruca right foot.  Plan: Discussed etiology pathology conservative therapies encouraged him to continue his medication until its all gone.  We went ahead and performed a surgical curettage of the wart today sent for pathologic evaluation he tolerated this procedure well after 2 cc of 50 admixed Marcaine plain lidocaine plain was infiltrated.  I will follow-up with him in 2 weeks to make sure he is doing well questions or concerns he will the office.
